# Patient Record
Sex: Male | Born: 1995 | Hispanic: Yes | Marital: Single | Smoking: Current every day smoker
Health system: Southern US, Community
[De-identification: ages and names within clinical notes are randomized; demographics above are authoritative.]

## PROBLEM LIST (undated history)

## (undated) DIAGNOSIS — J45909 Unspecified asthma, uncomplicated: Secondary | ICD-10-CM

## (undated) HISTORY — PX: NO PAST SURGERIES: SHX2092

## (undated) HISTORY — DX: Unspecified asthma, uncomplicated: J45.909

---

## 2016-10-31 ENCOUNTER — Ambulatory Visit (INDEPENDENT_AMBULATORY_CARE_PROVIDER_SITE_OTHER): Payer: Self-pay | Admitting: Family Medicine

## 2016-10-31 ENCOUNTER — Encounter: Payer: Self-pay | Admitting: Family Medicine

## 2016-10-31 VITALS — BP 120/82 | HR 100 | Temp 98.3°F | Resp 12 | Ht 67.0 in | Wt 191.0 lb

## 2016-10-31 DIAGNOSIS — J40 Bronchitis, not specified as acute or chronic: Secondary | ICD-10-CM

## 2016-10-31 MED ORDER — AMOXICILLIN 500 MG PO CAPS: 500.0000 mg | ORAL_CAPSULE | Freq: Three times a day (TID) | ORAL | 1 refills | Status: DC

## 2016-10-31 MED ORDER — GUAIFENESIN-CODEINE 100-10 MG/5ML PO SYRP: 5.0000 mL | ORAL_SOLUTION | Freq: Three times a day (TID) | ORAL | 0 refills | Status: DC | PRN

## 2016-10-31 NOTE — Progress Notes (Signed)
Name: Steven Werner   MRN: [id number]    DOB: 27-Oct-1995   Date:10/31/2016       Progress Note  Subjective  Chief Complaint  Chief Complaint  Patient presents with  . Sinusitis    cough and cong- green production, drainage- tried robitussin and theraflu oc/ no relief. Symptoms x 4 days. Fever x 2 days ago    Sinusitis  This is a new problem. The current episode started in the past 7 days. The problem has been gradually worsening since onset. The maximum temperature recorded prior to his arrival was 100.4 - 100.9 F. The pain is mild. Associated symptoms include chills, congestion, coughing, shortness of breath and sinus pressure. Pertinent negatives include no ear pain, headaches, neck pain or sore throat. The treatment provided mild relief.  Cough  This is a new problem. The current episode started in the past 7 days. The problem has been gradually worsening. The cough is productive of purulent sputum. Associated symptoms include chills, nasal congestion, postnasal drip, shortness of breath and wheezing. Pertinent negatives include no chest pain, ear congestion, ear pain, fever, headaches, heartburn, myalgias, rash, sore throat or weight loss. There is no history of environmental allergies.    No problem-specific Assessment & Plan notes found for this encounter.   Past Medical History:  Diagnosis Date  . Asthma     History reviewed. No pertinent surgical history.  Family History  Problem Relation Age of Onset  . Diabetes Paternal Grandfather     Social History   Social History  . Marital status: Single    Spouse name: N/A  . Number of children: N/A  . Years of education: N/A   Occupational History  . Not on file.   Social History Main Topics  . Smoking status: Current Some Day Smoker    Types: Cigarettes  . Smokeless tobacco: Never Used     Comment: info on quitting given  . Alcohol use Yes  . Drug use: No  . Sexual activity: No   Other Topics Concern  .  Not on file   Social History Narrative  . No narrative on file    No Known Allergies  No outpatient prescriptions prior to visit.   No facility-administered medications prior to visit.     Review of Systems  Constitutional: Positive for chills. Negative for fever, malaise/fatigue and weight loss.  HENT: Positive for congestion, postnasal drip and sinus pressure. Negative for ear discharge, ear pain and sore throat.   Eyes: Negative for blurred vision.  Respiratory: Positive for cough, shortness of breath and wheezing. Negative for sputum production.   Cardiovascular: Negative for chest pain, palpitations and leg swelling.  Gastrointestinal: Negative for abdominal pain, blood in stool, constipation, diarrhea, heartburn, melena and nausea.  Genitourinary: Negative for dysuria, frequency, hematuria and urgency.  Musculoskeletal: Negative for back pain, joint pain, myalgias and neck pain.  Skin: Negative for rash.  Neurological: Negative for dizziness, tingling, sensory change, focal weakness and headaches.  Endo/Heme/Allergies: Negative for environmental allergies and polydipsia. Does not bruise/bleed easily.  Psychiatric/Behavioral: Negative for depression and suicidal ideas. The patient is not nervous/anxious and does not have insomnia.      Objective  Vitals:   10/31/16 1124  BP: 120/82  Pulse: 100  Resp: 12  Temp: 98.3 F (36.8 C)  SpO2: 96%  Weight: 191 lb (86.6 kg)  Height:  (1.702 m)    Physical Exam  Constitutional: He is oriented to person, place, and time and  well-developed, well-nourished, and in no distress.  HENT:  Head: Normocephalic.  Right Ear: External ear normal.  Left Ear: External ear normal.  Nose: Nose normal.  Mouth/Throat: Oropharynx is clear and moist.  Eyes: Pupils are equal, round, and reactive to light. Conjunctivae and EOM are normal. Right eye exhibits no discharge. Left eye exhibits no discharge. No scleral icterus.  Neck: Normal  range of motion. Neck supple. No JVD present. No tracheal deviation present. No thyromegaly present.  Cardiovascular: Normal rate, regular rhythm, normal heart sounds and intact distal pulses.  Exam reveals no gallop and no friction rub.   No murmur heard. Pulmonary/Chest: Breath sounds normal. No respiratory distress. He has no wheezes. He has no rales.  Abdominal: Soft. Bowel sounds are normal. He exhibits no mass. There is no hepatosplenomegaly. There is no tenderness. There is no rebound, no guarding and no CVA tenderness.  Musculoskeletal: Normal range of motion. He exhibits no edema or tenderness.  Lymphadenopathy:    He has no cervical adenopathy.  Neurological: He is alert and oriented to person, place, and time. He has normal sensation, normal strength, normal reflexes and intact cranial nerves. No cranial nerve deficit.  Skin: Skin is warm. No rash noted.  Psychiatric: Mood and affect normal.  Nursing note and vitals reviewed.     Assessment & Plan  Problem List Items Addressed This Visit    None    Visit Diagnoses    Bronchitis    -  Primary   Relevant Medications   amoxicillin (AMOXIL) 500 MG capsule   guaiFENesin-codeine (ROBITUSSIN AC) 100-10 MG/5ML syrup      Meds ordered this encounter  Medications  . amoxicillin (AMOXIL) 500 MG capsule    Sig: Take 1 capsule (500 mg total) by mouth 3 (three) times daily.    Dispense:  30 capsule    Refill:  1  . guaiFENesin-codeine (ROBITUSSIN AC) 100-10 MG/5ML syrup    Sig: Take 5 mLs by mouth 3 (three) times daily as needed for cough.    Dispense:  150 mL    Refill:  0      Dr. Hayden [facility]  10/31/16

## 2018-03-22 ENCOUNTER — Emergency Department
Admission: EM | Admit: 2018-03-22 | Discharge: 2018-03-22 | Disposition: A | Discharge status: Home / Self Care | Payer: Self-pay | Attending: Emergency Medicine | Admitting: Emergency Medicine

## 2018-03-22 ENCOUNTER — Emergency Department: Payer: Self-pay

## 2018-03-22 ENCOUNTER — Encounter: Payer: Self-pay | Admitting: Intensive Care

## 2018-03-22 ENCOUNTER — Other Ambulatory Visit: Payer: Self-pay

## 2018-03-22 DIAGNOSIS — M62830 Muscle spasm of back: Secondary | ICD-10-CM | POA: Insufficient documentation

## 2018-03-22 DIAGNOSIS — F17210 Nicotine dependence, cigarettes, uncomplicated: Secondary | ICD-10-CM | POA: Insufficient documentation

## 2018-03-22 DIAGNOSIS — J45909 Unspecified asthma, uncomplicated: Secondary | ICD-10-CM | POA: Insufficient documentation

## 2018-03-22 MED ORDER — CYCLOBENZAPRINE HCL 5 MG PO TABS: 5.0000 mg | ORAL_TABLET | Freq: Three times a day (TID) | ORAL | 0 refills | Status: DC | PRN

## 2018-03-22 MED ORDER — CYCLOBENZAPRINE HCL 10 MG PO TABS
10.0000 mg | ORAL_TABLET | Freq: Once | ORAL | Status: AC
  Administered 2018-03-22: 10 mg via ORAL
  Filled 2018-03-22: qty 1

## 2018-03-22 MED ORDER — IBUPROFEN 800 MG PO TABS: 800.0000 mg | ORAL_TABLET | Freq: Three times a day (TID) | ORAL | 0 refills | Status: DC | PRN

## 2018-03-22 MED ORDER — IBUPROFEN 800 MG PO TABS
800.0000 mg | ORAL_TABLET | Freq: Once | ORAL | Status: AC
  Administered 2018-03-22: 800 mg via ORAL
  Filled 2018-03-22: qty 1

## 2018-03-22 NOTE — ED Notes (Signed)
Pt with c/o of mid back pain x 2 days. Pt has full movement x4 of extremities. Pt ambulatory to flex room.

## 2018-03-22 NOTE — Discharge Instructions (Addendum)
Your exam is normal following your exam. Take the prescription meds as directed. Apply ice and/or moist heat to reduce symptoms. Follow-up with your provider for ongoing symptoms.   Su examen es normal despus del examen. Tome los medicamentos recetados segn las instrucciones. Aplique hielo y/o calor hmedo para reducir los sntomas. Seguimiento con su proveedor para los sntomas continuos.

## 2018-03-22 NOTE — ED Provider Notes (Signed)
[facility] Emergency Department Provider Note ____________________________________________  Time seen: 1515  I have reviewed the triage vital signs and the nursing notes.  HISTORY  Chief Complaint  Back Pain  History limited by Spanish language.  Patient male companion is present for interview and exam.  HPI Shooter Villapando is a 23 y.o. male presents to the ED for evaluation of mid back pain.  Patient denies any known injury, accident, or trauma.  He localizes pain to the left side of the chest wall posteriorly at the inferior scapular border.  He denies any chest pain, shortness of breath, or syncope.  He is a current everyday smoker, but denies any fevers, cough, or congestion.  Past Medical History:  Diagnosis Date  . Asthma     There are no active problems to display for this patient.   History reviewed. No pertinent surgical history.  Prior to Admission medications   Medication Sig Start Date End Date Taking? Authorizing Provider  cyclobenzaprine (FLEXERIL) 5 MG tablet Take 1 tablet (5 mg total) by mouth 3 (three) times daily as needed for muscle spasms. 03/22/18   Ilan Kahrs, Charlesetta Ivory, PA-C  ibuprofen (ADVIL,MOTRIN) 800 MG tablet Take 1 tablet (800 mg total) by mouth every 8 (eight) hours as needed. 03/22/18   Jesyca Weisenburger, Charlesetta Ivory, PA-C   Allergies Patient has no known allergies.  Family History  Problem Relation Age of Onset  . Diabetes Paternal Grandfather     Social History Social History   Tobacco Use  . Smoking status: Current Some Day Smoker    Types: Cigarettes  . Smokeless tobacco: Never Used  . Tobacco comment: info on quitting given  Substance Use Topics  . Alcohol use: Yes    Alcohol/week: 18.0 standard drinks    Types: 18 Cans of beer per week  . Drug use: No    Review of Systems  Constitutional: Negative for fever. Eyes: Negative for visual changes. ENT: Negative for sore throat. Cardiovascular: Negative  for chest pain. Respiratory: Negative for shortness of breath. Gastrointestinal: Negative for abdominal pain, vomiting and diarrhea. Genitourinary: Negative for dysuria. Musculoskeletal: Positive for upper back pain. Skin: Negative for rash. Neurological: Negative for headaches, focal weakness or numbness. ____________________________________________  PHYSICAL EXAM:  VITAL SIGNS: ED Triage Vitals  Enc Vitals Group     BP 03/22/18 1323 127/74     Pulse Rate 03/22/18 1323 86     Resp 03/22/18 1323 16     Temp 03/22/18 1323 98.2 F (36.8 C)     Temp Source 03/22/18 1323 Oral     SpO2 03/22/18 1323 96 %     Weight 03/22/18 1324 190 lb (86.2 kg)     Height 03/22/18 1324 5\' 5"  (1.651 m)     Head Circumference --      Peak Flow --      Pain Score 03/22/18 1324 6     Pain Loc --      Pain Edu? --      Excl. in GC? --     Constitutional: Alert and oriented. Well appearing and in no distress. Head: Normocephalic and atraumatic. Eyes: Conjunctivae are normal. Normal extraocular movements Cardiovascular: Normal rate, regular rhythm. Normal distal pulses. Respiratory: Normal respiratory effort. No wheezes/rales/rhonchi. Gastrointestinal: Soft and nontender. No distention. Musculoskeletal: Normal spinal alignment without midline tenderness, spasm, deformity, or step-off.  No obvious chest wall deformity is appreciated.  Patient localized tenderness to the left chest wall at the inferior scapular border.  Nontender with normal range of motion in all extremities.  Neurologic:  Normal gait without ataxia. Normal speech and language. No gross focal neurologic deficits are appreciated. Skin:  Skin is warm, dry and intact. No rash noted. ___________________________________________   RADIOLOGY  CXR  Negative ____________________________________________  PROCEDURES  Procedures Flexeril 10 mg PO IBU 800 mg PO ____________________________________________  INITIAL IMPRESSION /  ASSESSMENT AND PLAN / ED COURSE  Patient with ED evaluation of posterior thoracic muscle pain.  No preceding injury or accident or trauma is reported.  No shortness of breath, substernal chest pain, nausea, vomiting, diuresis is reported.  Patient is reassured by his negative chest x-ray at this time.  He will take prescriptions for Flexeril ibuprofen for muscle spasm and pain relief, respectively.  A work note is provided for 1 day as requested.  Follow-up with his primary provider for ongoing symptoms. ____________________________________________  FINAL CLINICAL IMPRESSION(S) / ED DIAGNOSES  Final diagnoses:  Spasm of thoracic back muscle      Karmen StabsMenshew, Charlesetta IvoryJenise V Bacon, PA-C 03/23/18 1913    Phineas SemenGoodman, Graydon, MD 03/23/18 1944

## 2018-03-22 NOTE — ED Triage Notes (Signed)
Patient c/o mid back pain X2 days. Denies injury/trauma

## 2018-03-22 NOTE — ED Notes (Signed)
Pt verbalized understanding of discharge instructions. NAD at this time. 

## 2018-11-28 ENCOUNTER — Other Ambulatory Visit: Payer: Self-pay

## 2018-11-28 DIAGNOSIS — Z20822 Contact with and (suspected) exposure to covid-19: Secondary | ICD-10-CM

## 2018-11-30 LAB — NOVEL CORONAVIRUS, NAA: SARS-CoV-2, NAA: NOT DETECTED

## 2018-12-01 ENCOUNTER — Telehealth: Payer: Self-pay | Admitting: Family Medicine

## 2018-12-01 NOTE — Telephone Encounter (Signed)
Patient informed of results and would like a copy mailed to his address.

## 2019-02-20 ENCOUNTER — Other Ambulatory Visit: Payer: Self-pay

## 2019-02-20 ENCOUNTER — Emergency Department
Admission: EM | Admit: 2019-02-20 | Discharge: 2019-02-20 | Disposition: A | Discharge status: Home / Self Care | Payer: Self-pay | Attending: Emergency Medicine | Admitting: Emergency Medicine

## 2019-02-20 DIAGNOSIS — M545 Low back pain, unspecified: Secondary | ICD-10-CM

## 2019-02-20 DIAGNOSIS — F17210 Nicotine dependence, cigarettes, uncomplicated: Secondary | ICD-10-CM | POA: Insufficient documentation

## 2019-02-20 DIAGNOSIS — J45909 Unspecified asthma, uncomplicated: Secondary | ICD-10-CM | POA: Insufficient documentation

## 2019-02-20 NOTE — ED Provider Notes (Signed)
[facility] Emergency Department Provider Note  ____________________________________________   First MD Initiated Contact with Patient 02/20/19 1321     (approximate)  I have reviewed the triage vital signs and the nursing notes.   HISTORY  Chief Complaint Back Pain    HPI Steven Werner is a 24 y.o. male presents to the emergency department complaining of low back pain.  History of similar symptoms.  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  He was afraid it might be his kidneys.  Denies any burning with urination, blood in the urine, or upper back pain.    Past Medical History:  Diagnosis Date  . Asthma     There are no problems to display for this patient.   History reviewed. No pertinent surgical history.  Prior to Admission medications   Not on File    Allergies Patient has no known allergies.  Family History  Problem Relation Age of Onset  . Diabetes Paternal Grandfather     Social History Social History   Tobacco Use  . Smoking status: Current Some Day Smoker    Types: Cigarettes  . Smokeless tobacco: Never Used  . Tobacco comment: info on quitting given  Substance Use Topics  . Alcohol use: Yes    Alcohol/week: 18.0 standard drinks    Types: 18 Cans of beer per week  . Drug use: No    Review of Systems  Constitutional: No fever/chills Eyes: No visual changes. ENT: No sore throat. Respiratory: Denies cough Cardiovascular: Denies chest pain Gastrointestinal: Denies abdominal pain Genitourinary: Negative for dysuria. Musculoskeletal: Positive for back pain. Skin: Negative for rash. Psychiatric: no mood changes,     ____________________________________________   PHYSICAL EXAM:  VITAL SIGNS: ED Triage Vitals  Enc Vitals Group     BP 02/20/19 1246 97/74     Pulse Rate 02/20/19 1246 77     Resp 02/20/19 1246 18     Temp 02/20/19 1246 97.9 F (36.6 C)     Temp Source 02/20/19 1246 Oral      SpO2 02/20/19 1246 100 %     Weight 02/20/19 1247 180 lb (81.6 kg)     Height 02/20/19 1247 5\' 10"  (1.778 m)     Head Circumference --      Peak Flow --      Pain Score 02/20/19 1246 5     Pain Loc --      Pain Edu? --      Excl. in GC? --     Constitutional: Alert and oriented. Well appearing and in no acute distress. Eyes: Conjunctivae are normal.  Head: Atraumatic. Nose: No congestion/rhinnorhea. Mouth/Throat: Mucous membranes are moist.   Neck:  supple no lymphadenopathy noted Cardiovascular: Normal rate, regular rhythm. Respiratory: Normal respiratory effort.  No retractions,  GU: deferred Musculoskeletal: FROM all extremities, warm and well perfused, spine is nontender, no CVA tenderness, neurovascular symptoms Neurologic:  Normal speech and language.  Skin:  Skin is warm, dry and intact. No rash noted. Psychiatric: Mood and affect are normal. Speech and behavior are normal.  ____________________________________________   LABS (all labs ordered are listed, but only abnormal results are displayed)  Labs Reviewed - No data to display ____________________________________________   ____________________________________________  RADIOLOGY    ____________________________________________   PROCEDURES  Procedure(s) performed: No  Procedures    ____________________________________________   INITIAL IMPRESSION / ASSESSMENT AND PLAN / ED COURSE  Pertinent labs & imaging results that were available  during my care of the patient were reviewed by me and considered in my medical decision making (see chart for details).   Patient is 24 year old male presents emergency department low back pain.  See HPI  Physical exam appears well.  Minimally tender at the SI joints.  Explained findings to the patient.  Gave him reassurance that the lower lumbar area that he is having pain is not typical for kidney problems.  He states he understands.  He is instructed to take  Tylenol or ibuprofen for pain if needed.  Return emergency department worsening.  Follow-up orthopedics.  States he understands will comply.  Is discharged stable condition.    Steven Werner was evaluated in Emergency Department on 02/20/2019 for the symptoms described in the history of present illness. He was evaluated in the context of the global COVID-19 pandemic, which necessitated consideration that the patient might be at risk for infection with the SARS-CoV-2 virus that causes COVID-19. Institutional protocols and algorithms that pertain to the evaluation of patients at risk for COVID-19 are in a state of rapid change based on information released by regulatory bodies including the CDC and federal and state organizations. These policies and algorithms were followed during the patient's care in the ED.   As part of my medical decision making, I reviewed the following data within the [place] notes reviewed and incorporated, Interpreter needed, Old chart reviewed, Notes from prior ED visits and [place] Controlled Substance Database  ____________________________________________   FINAL CLINICAL IMPRESSION(S) / ED DIAGNOSES  Final diagnoses:  Acute midline low back pain without sciatica      NEW MEDICATIONS STARTED DURING THIS VISIT:  There are no discharge medications for this patient.    Note:  This document was prepared using Dragon voice recognition software and may include unintentional dictation errors.    Versie Starks, PA-C 02/20/19 1531    Duffy Bruce, MD 02/21/19 [phone]

## 2019-02-20 NOTE — ED Triage Notes (Signed)
Pt comes POV with lower back pain. Pt seenhere a year ago for same and says it hasn't gotten better.

## 2019-02-20 NOTE — ED Notes (Signed)
See triage note  Presents with back pain w/o injury

## 2019-05-08 ENCOUNTER — Ambulatory Visit: Payer: Self-pay | Attending: Internal Medicine

## 2019-05-08 DIAGNOSIS — Z23 Encounter for immunization: Secondary | ICD-10-CM

## 2019-05-08 NOTE — Progress Notes (Signed)
   Covid-19 Vaccination Clinic  Name:  Steven Werner    MRN: [id number] DOB: 28-Dec-1995  05/08/2019  Mr. Steven Werner was observed post Covid-19 immunization for 15 minutes without incident. He was provided with Vaccine Information Sheet and instruction to access the V-Safe system.   Mr. Steven Werner was instructed to call 911 with any severe reactions post vaccine: Marland Kitchen Difficulty breathing  . Swelling of face and throat  . A fast heartbeat  . A bad rash all over body  . Dizziness and weakness   Immunizations Administered    Name Date Dose VIS Date Route   Pfizer COVID-19 Vaccine 05/08/2019  1:52 PM 0.3 mL 01/23/2019 Intramuscular   Manufacturer: [employer], [employer]   Lot: [id number]   NDC: [address]-6

## 2019-05-29 ENCOUNTER — Ambulatory Visit: Payer: Self-pay | Attending: Internal Medicine

## 2019-05-29 DIAGNOSIS — Z23 Encounter for immunization: Secondary | ICD-10-CM

## 2019-05-29 NOTE — Progress Notes (Signed)
   Covid-19 Vaccination Clinic  Name:  Steven Werner    MRN: [id number] DOB: 1995/03/07  05/29/2019  Mr. Steven Werner was observed post Covid-19 immunization for 15 minutes without incident. He was provided with Vaccine Information Sheet and instruction to access the V-Safe system.   Mr. Steven Werner was instructed to call 911 with any severe reactions post vaccine: Marland Kitchen Difficulty breathing  . Swelling of face and throat  . A fast heartbeat  . A bad rash all over body  . Dizziness and weakness   Immunizations Administered    Name Date Dose VIS Date Route   Pfizer COVID-19 Vaccine 05/29/2019  1:43 PM 0.3 mL 01/23/2019 Intramuscular   Manufacturer: [employer], [employer]   Lot: [id number]   NDC: [address]-4

## 2019-07-23 ENCOUNTER — Other Ambulatory Visit: Payer: Self-pay

## 2019-07-23 ENCOUNTER — Ambulatory Visit
Admission: EM | Admit: 2019-07-23 | Discharge: 2019-07-23 | Disposition: A | Discharge status: Home / Self Care | Payer: Self-pay | Attending: Family Medicine | Admitting: Family Medicine

## 2019-07-23 ENCOUNTER — Ambulatory Visit (INDEPENDENT_AMBULATORY_CARE_PROVIDER_SITE_OTHER): Payer: Self-pay

## 2019-07-23 ENCOUNTER — Encounter: Payer: Self-pay | Admitting: Emergency Medicine

## 2019-07-23 DIAGNOSIS — S99929A Unspecified injury of unspecified foot, initial encounter: Secondary | ICD-10-CM

## 2019-07-23 DIAGNOSIS — M79675 Pain in left toe(s): Secondary | ICD-10-CM

## 2019-07-23 MED ORDER — MELOXICAM 15 MG PO TABS: 15.0000 mg | ORAL_TABLET | Freq: Every day | ORAL | 0 refills | Status: AC | PRN

## 2019-07-23 MED ORDER — CEPHALEXIN 500 MG PO CAPS: 500.0000 mg | ORAL_CAPSULE | Freq: Four times a day (QID) | ORAL | 0 refills | Status: AC

## 2019-07-23 NOTE — ED Triage Notes (Signed)
Patient triaged with interpreter # Steven Werner [phone]. Patient in today with an injury to his right great toe yesterday. Patient states he was climbing stairs and fell injuring his toe.

## 2019-07-23 NOTE — Discharge Instructions (Signed)
Descanso, hielo, elevacin.  Medicacin segn lo prescrito.  Es probable que Warden/ranger.  Dr. Adriana Simas

## 2019-07-23 NOTE — ED Provider Notes (Signed)
[facility]    CSN: [id number] Arrival date & time: 07/23/19  1433      History   Chief Complaint Chief Complaint  Patient presents with   Toe Injury    HPI  24 year old male presents with a toe injury.  Patient is Spanish-speaking.  Spanish interpreter used today throughout the visit.  Patient reports that he was going up some steps and fell and injured his right great toe.  This occurred yesterday.  Patient reports that there was some bleeding from around his toenail.  He states that he is currently experiencing pain and swelling of the great toe.  Pain 6/10 in severity.  No relieving factors.  No other associated symptoms.  No other complaints.   Past Medical History:  Diagnosis Date   Asthma    Past Surgical History:  Procedure Laterality Date   NO PAST SURGERIES     Home Medications    Prior to Admission medications   Medication Sig Start Date End Date Taking? Authorizing Provider  cephALEXin (KEFLEX) 500 MG capsule Take 1 capsule (500 mg total) by mouth 4 (four) times daily for 7 days. 07/23/19 07/30/19  Tommie Sams, DO  meloxicam (MOBIC) 15 MG tablet Take 1 tablet (15 mg total) by mouth daily as needed for pain. 07/23/19   Tommie Sams, DO    Family History Family History  Problem Relation Age of Onset   Diabetes Paternal Grandfather    Healthy Mother    Healthy Father     Social History Social History   Tobacco Use   Smoking status: Current Some Day Smoker    Types: Cigarettes   Smokeless tobacco: Never Used   Tobacco comment: 2 cigarettes per day  Vaping Use   Vaping Use: Never used  Substance Use Topics   Alcohol use: Yes    Alcohol/week: 14.0 standard drinks    Types: 14 Cans of beer per week   Drug use: No     Allergies   Patient has no known allergies.   Review of Systems Review of Systems  Musculoskeletal:       Right great toe injury.   Physical Exam Triage Vital Signs ED Triage Vitals  Enc Vitals  Group     BP 07/23/19 1448 (!) 147/83     Pulse Rate 07/23/19 1448 80     Resp 07/23/19 1448 18     Temp 07/23/19 1448 98.4 F (36.9 C)     Temp Source 07/23/19 1448 Oral     SpO2 07/23/19 1448 98 %     Weight 07/23/19 1458 211 lb 10.3 oz (96 kg)     Height --      Head Circumference --      Peak Flow --      Pain Score 07/23/19 1456 6     Pain Loc --      Pain Edu? --      Excl. in GC? --    Updated Vital Signs BP (!) 147/83 (BP Location: Left Arm)    Pulse 80    Temp 98.4 F (36.9 C) (Oral)    Resp 18    Wt 96 kg    SpO2 98%    BMI 30.37 kg/m   Visual Acuity Right Eye Distance:   Left Eye Distance:   Bilateral Distance:    Right Eye Near:   Left Eye Near:    Bilateral Near:     Physical Exam Vitals and nursing note reviewed.  Constitutional:      General: He is not in acute distress.    Appearance: Normal appearance. He is not ill-appearing.  HENT:     Head: Normocephalic and atraumatic.  Eyes:     General:        Right eye: No discharge.        Left eye: No discharge.     Conjunctiva/sclera: Conjunctivae normal.  Cardiovascular:     Rate and Rhythm: Normal rate and regular rhythm.     Heart sounds: No murmur heard.   Pulmonary:     Effort: Pulmonary effort is normal.     Breath sounds: Normal breath sounds. No wheezing, rhonchi or rales.  Musculoskeletal:       Feet:  Feet:     Comments: Corner of the nailbed elevated.  Swelling of the right great toe particularly at the MCP joint.  Mild surrounding erythema. Neurological:     Mental Status: He is alert.  Psychiatric:        Mood and Affect: Mood normal.        Behavior: Behavior normal.    UC Treatments / Results  Labs (all labs ordered are listed, but only abnormal results are displayed) Labs Reviewed - No data to display  EKG   Radiology DG Toe Great Right  Result Date: 07/23/2019 CLINICAL DATA:  Fall, pain, bleeding and great toe EXAM: RIGHT GREAT TOE COMPARISON:  None. FINDINGS:  Irregularity at the base of the right great toe distal phalanx concerning for fracture. No significant displacement. No subluxation or dislocation. IMPRESSION: Findings suspicious for nondisplaced fracture at the base of the right great toe distal phalanx. Electronically Signed   By: Rolm Baptise M.D.   On: 07/23/2019 15:48    Procedures Procedures (including critical care time)  Medications Ordered in UC Medications - No data to display  Initial Impression / Assessment and Plan / UC Course  I have reviewed the triage vital signs and the nursing notes.  Pertinent labs & imaging results that were available during my care of the patient were reviewed by me and considered in my medical decision making (see chart for details).    24 year old male presents with an injury to the great toe.  Patient has a nailbed injury.  Injury occurred yesterday thus suturing/tacking his nail down poses a high risk of infection and was not done today.  He will most likely lose the nail.  He was okay with the possibility of losing the nail. X-ray was obtained and revealed possible fracture at the base of the distal phalanx.  Meloxicam as directed.  Rest, ice, elevation.  Supportive care.  **Radiology report returned after the patient was discharged.  Patient was notified via phone about possible fracture. Sending in Keflex to cover for potential infection given possible fracture and nailbed injury.  Final Clinical Impressions(s) / UC Diagnoses   Final diagnoses:  Injury of nail bed of toe  Pain of left great toe     Discharge Instructions     Descanso, hielo, elevacin.  Medicacin segn lo prescrito.  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  Dr. Lacinda Axon     ED Prescriptions    Medication Sig Dispense Auth. Provider   meloxicam (MOBIC) 15 MG tablet Take 1 tablet (15 mg total) by mouth daily as needed for pain. 14 tablet Tucker Minter G, DO   cephALEXin (KEFLEX) 500 MG capsule Take 1 capsule (500 mg total) by  mouth 4 (four) times daily for 7 days.  28 capsule Everlene Other G, DO     PDMP not reviewed this encounter.   Tommie Sams, [place] 07/23/19 1659

## 2019-12-13 ENCOUNTER — Ambulatory Visit
Admission: EM | Admit: 2019-12-13 | Discharge: 2019-12-13 | Disposition: A | Discharge status: Home / Self Care | Payer: Self-pay | Attending: Student | Admitting: Student

## 2019-12-13 ENCOUNTER — Encounter: Payer: Self-pay | Admitting: Emergency Medicine

## 2019-12-13 ENCOUNTER — Other Ambulatory Visit: Payer: Self-pay

## 2019-12-13 DIAGNOSIS — R42 Dizziness and giddiness: Secondary | ICD-10-CM | POA: Insufficient documentation

## 2019-12-13 DIAGNOSIS — F419 Anxiety disorder, unspecified: Secondary | ICD-10-CM | POA: Insufficient documentation

## 2019-12-13 LAB — URINALYSIS, COMPLETE (UACMP) WITH MICROSCOPIC
Bacteria, UA: NONE SEEN
Bilirubin Urine: NEGATIVE
Glucose, UA: NEGATIVE mg/dL
Ketones, ur: NEGATIVE mg/dL
Leukocytes,Ua: NEGATIVE
Nitrite: NEGATIVE
Protein, ur: NEGATIVE mg/dL
Specific Gravity, Urine: 1.015 (ref 1.005–1.030)
Squamous Epithelial / HPF: NONE SEEN (ref 0–5)
WBC, UA: NONE SEEN WBC/hpf (ref 0–5)
pH: 7 (ref 5.0–8.0)

## 2019-12-13 LAB — GLUCOSE, CAPILLARY: Glucose-Capillary: 108 mg/dL — ABNORMAL HIGH (ref 70–99)

## 2019-12-13 NOTE — ED Triage Notes (Signed)
Patient triaged via interpreter Rande Lawman # [phone]. Patient in today c/o dizziness on 12/11/19 and 12/12/19. Patient denies dizziness today. Patient denies any other symptoms.

## 2019-12-13 NOTE — Discharge Instructions (Addendum)
-  Contact the Clinic provided above to establish care. -Make sure you are drinking plenty of water during the day. -If dizziness worsens or becomes more often, can return to clinic for further evaluation. -Proceed to ER if you develops any shortness of breath, chest pain or loss of consciousness.

## 2019-12-13 NOTE — ED Provider Notes (Signed)
[facility]    CSN: [id number] Arrival date & time: 12/13/19  1248      History   Chief Complaint Chief Complaint  Patient presents with  . Dizziness    HPI Steven Werner is a 24 y.o. male who presents today for evaluation of 2 separate incidents of dizziness.  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  He states that yesterday and the day before there were occasions where for several seconds he felt dizzy.  He denies any loss of consciousness he denies any falls he denies any chest pain, shortness of breath or headaches.  He states that he felt dizzy, the room did not spin when this occurred.  He denies any past history of cardiac trouble, he denies any back pain or abdominal pain associated with this.  He is not diabetic, he does not have a primary care provider.  He does state that recently he has been under more stress, he states that recently a friend of his did pass away, he consumes 2 beers per day but does feel that he has not been able to sleep well.  He has never experienced these symptoms before, no history of migraines for the patient.  No weakness to bilateral upper or lower extremities.  No facial asymmetry.  He states that he is not experiencing any dizziness at today's appointment.  Information is gathered through the interpreter at today's visit.  HPI  Past Medical History:  Diagnosis Date  . Asthma     There are no problems to display for this patient.   Past Surgical History:  Procedure Laterality Date  . NO PAST SURGERIES         Home Medications    Prior to Admission medications   Medication Sig Start Date End Date Taking? Authorizing Provider  meloxicam (MOBIC) 15 MG tablet Take 1 tablet (15 mg total) by mouth daily as needed for pain. 07/23/19   Tommie Sams, DO    Family History Family History  Problem Relation Age of Onset  . Diabetes Paternal Grandfather   . Healthy Mother   .  Healthy Father     Social History Social History   Tobacco Use  . Smoking status: Current Every Day Smoker    Packs/day: 0.25    Years: 4.00    Pack years: 1.00    Types: Cigarettes  . Smokeless tobacco: Never Used  . Tobacco comment: 4-6 cig in the evening  Vaping Use  . Vaping Use: Never used  Substance Use Topics  . Alcohol use: Yes    Alcohol/week: 14.0 standard drinks    Types: 14 Cans of beer per week  . Drug use: No     Allergies   Patient has no known allergies.   Review of Systems Review of Systems  Constitutional: Negative.   HENT: Negative.   Eyes: Negative.   Respiratory: Negative for shortness of breath.   Cardiovascular: Negative for chest pain and palpitations.  Gastrointestinal: Negative.   Endocrine: Negative.   Genitourinary: Negative.   Musculoskeletal: Negative.   Skin: Negative.   Allergic/Immunologic: Negative.   Neurological: Positive for dizziness.  Hematological: Negative.   Psychiatric/Behavioral: Negative.    Physical Exam Triage Vital Signs ED Triage Vitals  Enc Vitals Group     BP 12/13/19 1258 (!) 154/100     Pulse Rate 12/13/19 1258 70     Resp 12/13/19 1258 18  Temp 12/13/19 1258 98.5 F (36.9 C)     Temp Source 12/13/19 1258 Oral     SpO2 12/13/19 1258 99 %     Weight 12/13/19 1302 209 lb 7 oz (95 kg)     Height 12/13/19 1302 5\' 9"  (1.753 m)     Head Circumference --      Peak Flow --      Pain Score 12/13/19 1302 0     Pain Loc --      Pain Edu? --      Excl. in GC? --    Orthostatic VS for the past 24 hrs:  BP- Lying Pulse- Lying BP- Sitting Pulse- Sitting BP- Standing at 0 minutes Pulse- Standing at 0 minutes  12/13/19 1334 134/90 71 (!) 147/117 87 131/85 94    Updated Vital Signs BP (!) 153/98 (BP Location: Right Arm) Comment: lg cuff  Pulse 70   Temp 98.5 F (36.9 C) (Oral)   Resp 18   Ht 5\' 9"  (1.753 m)   Wt 209 lb 7 oz (95 kg)   SpO2 99%   BMI 30.93 kg/m   Visual Acuity Right Eye Distance:     Left Eye Distance:   Bilateral Distance:    Right Eye Near:   Left Eye Near:    Bilateral Near:     Physical Exam Vitals reviewed.  Constitutional:      General: He is not in acute distress.    Appearance: Normal appearance. He is not ill-appearing, toxic-appearing or diaphoretic.  HENT:     Head: Normocephalic.     Right Ear: Tympanic membrane and ear canal normal.     Left Ear: Tympanic membrane and ear canal normal.     Nose: Nose normal. No congestion or rhinorrhea.     Mouth/Throat:     Mouth: Mucous membranes are dry.     Pharynx: Oropharynx is clear. No posterior oropharyngeal erythema.  Eyes:     Extraocular Movements: Extraocular movements intact.     Conjunctiva/sclera: Conjunctivae normal.     Pupils: Pupils are equal, round, and reactive to light.  Cardiovascular:     Rate and Rhythm: Normal rate and regular rhythm.     Pulses: Normal pulses.     Heart sounds: Normal heart sounds. No murmur heard.  No friction rub. No gallop.   Pulmonary:     Effort: Pulmonary effort is normal. No respiratory distress.     Breath sounds: Normal breath sounds. No stridor. No wheezing or rales.  Abdominal:     General: Abdomen is flat. Bowel sounds are normal. There is no distension.     Palpations: Abdomen is soft.  Musculoskeletal:     Cervical back: Normal range of motion.  Lymphadenopathy:     Cervical: No cervical adenopathy.  Neurological:     General: No focal deficit present.     Mental Status: He is alert and oriented to person, place, and time.     Cranial Nerves: No cranial nerve deficit.     Sensory: No sensory deficit.     Motor: No weakness.     Coordination: Coordination normal.     Gait: Gait normal.     Deep Tendon Reflexes: Reflexes normal.  Psychiatric:        Mood and Affect: Mood normal.        Behavior: Behavior normal.    UC Treatments / Results  Labs (all labs ordered are listed, but only abnormal results are displayed) Labs Reviewed  URINALYSIS, COMPLETE (UACMP) WITH MICROSCOPIC - Abnormal; Notable for the following components:      Result Value   Hgb urine dipstick TRACE (*)    All other components within normal limits  GLUCOSE, CAPILLARY - Abnormal; Notable for the following components:   Glucose-Capillary 108 (*)    All other components within normal limits  GLUCOSE, RANDOM    EKG   Radiology No results found.  Procedures Procedures (including critical care time)  Medications Ordered in UC Medications - No data to display  Initial Impression / Assessment and Plan / UC Course  I have reviewed the triage vital signs and the nursing notes.  Pertinent labs & imaging results that were available during my care of the patient were reviewed by me and considered in my medical decision making (see chart for details).     1.  Treatment options were discussed today with the patient. 2.  The patient is not experiencing any dizziness today, no evidence of ear infection, no murmur or arrhythmia.  No chest pain or shortness of breath. 3.  Glucose obtained was not fasting, no evidence of severe dehydration with urinalysis.  Orthostatics obtained.  4.  I do believe that there could be a level stress induced, he does report that he has been under more stress recently.  The patient was provided with relaxation exercises to perform on a routine basis. 5.  The patient was given information for the [facility], I would like for him to establish care at this clinic for continued monitoring for possible antianxiety medication in addition to blood pressure medications. 6.  Instructed the patient have dizziness worsens or continues or if he develops any chest pain, shortness of breath, severe abdominal pain to proceed to the emergency room. Final Clinical Impressions(s) / UC Diagnoses   Final diagnoses:  Dizziness  Anxiety     Discharge Instructions     -Contact the Clinic provided above to establish care. -Make  sure you are drinking plenty of water during the day. -If dizziness worsens or becomes more often, can return to clinic for further evaluation. -Proceed to ER if you develops any shortness of breath, chest pain or loss of consciousness.    ED Prescriptions    None     PDMP not reviewed this encounter.   Anson [place], PA-C 12/13/19 1640

## 2021-01-24 IMAGING — CR DG TOE GREAT 2+V*R*
3 series · 3 of 3 positions shown · non-contrast
Comparison: None.

CLINICAL DATA: Fall, pain, bleeding and great toe

EXAM:
RIGHT GREAT TOE

[toe ap]
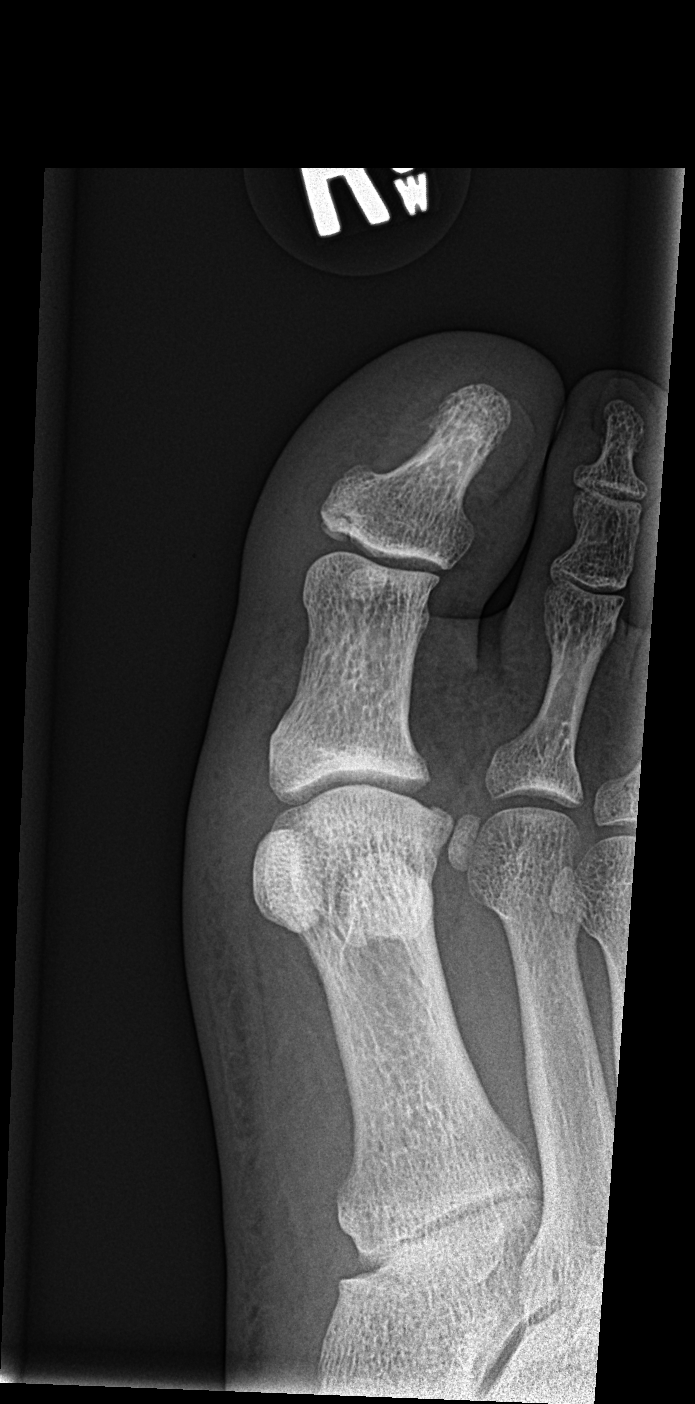

[toe obl]
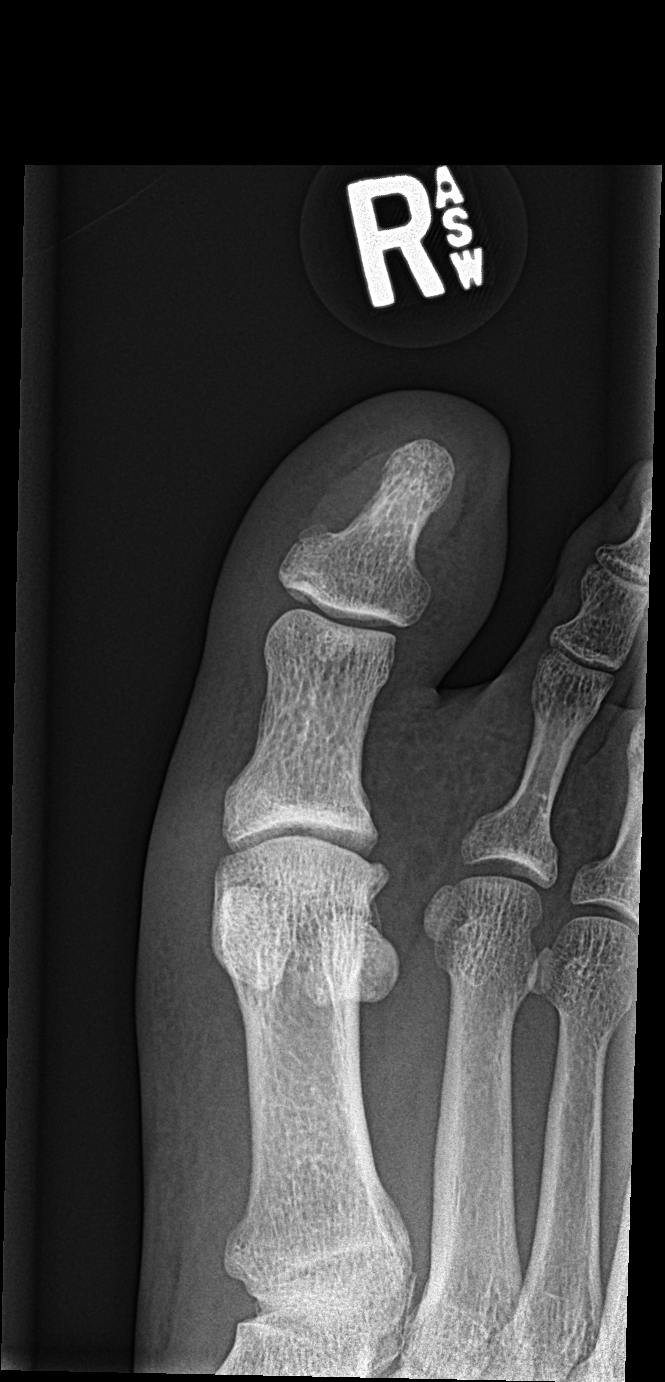

[toe lat]
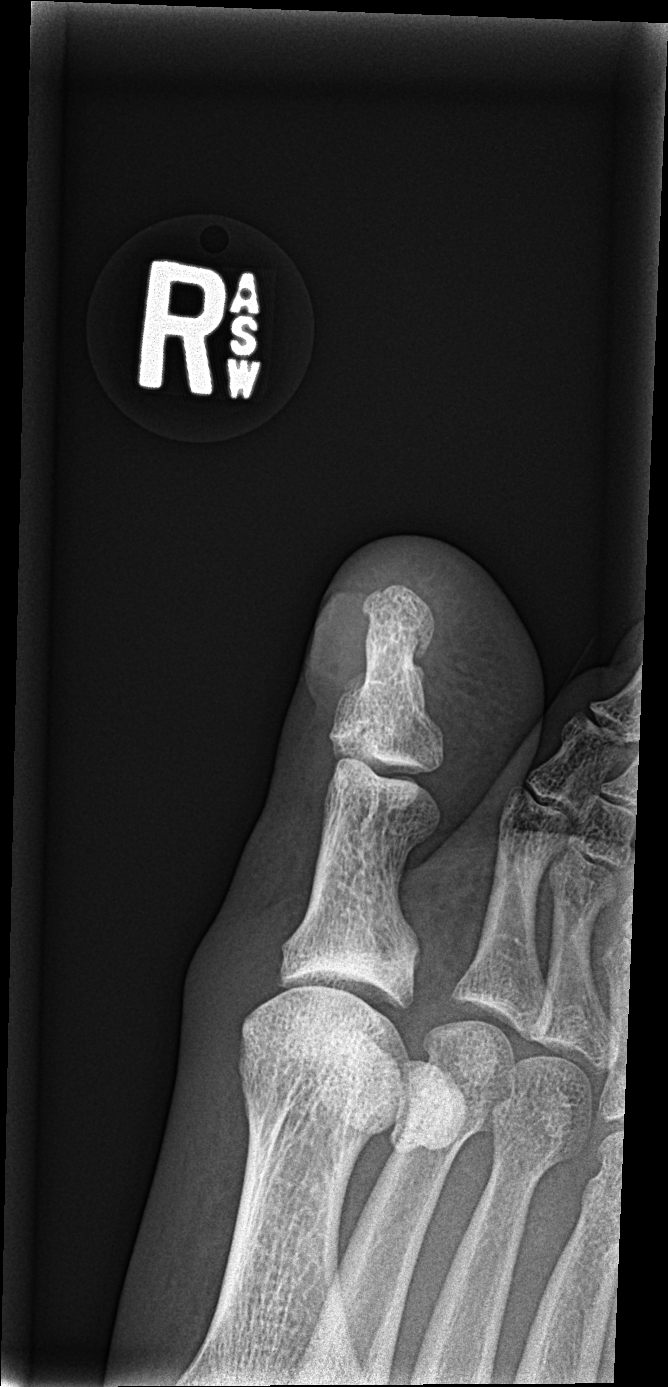

[3 of 3 positions shown; findings below may reference images not displayed]

FINDINGS: Irregularity at the base of the right great toe distal phalanx
concerning for fracture. No significant displacement. No subluxation
or dislocation.
IMPRESSION: Findings suspicious for nondisplaced fracture at the base of the
right great toe distal phalanx.
